# Patient Record
Sex: Female | Born: 1945
Health system: Southern US, Community
[De-identification: ages and names within clinical notes are randomized; demographics above are authoritative.]

---

## 1999-03-15 ENCOUNTER — Other Ambulatory Visit: Admission: RE | Admit: 1999-03-15 | Discharge: 1999-03-15 | Payer: Self-pay | Admitting: Internal Medicine

## 2000-11-11 ENCOUNTER — Encounter: Payer: Self-pay | Admitting: Internal Medicine

## 2000-11-11 ENCOUNTER — Encounter: Admission: RE | Admit: 2000-11-11 | Discharge: 2000-11-11 | Payer: Self-pay | Admitting: Internal Medicine

## 2001-01-29 ENCOUNTER — Encounter: Admission: RE | Admit: 2001-01-29 | Discharge: 2001-01-29 | Payer: Self-pay | Admitting: Urology

## 2001-01-29 ENCOUNTER — Encounter: Payer: Self-pay | Admitting: Urology

## 2001-11-04 ENCOUNTER — Other Ambulatory Visit: Admission: RE | Admit: 2001-11-04 | Discharge: 2001-11-04 | Payer: Self-pay | Admitting: Internal Medicine

## 2001-11-24 ENCOUNTER — Ambulatory Visit (HOSPITAL_COMMUNITY): Admission: RE | Admit: 2001-11-24 | Discharge: 2001-11-24 | Payer: Self-pay | Admitting: Internal Medicine

## 2003-11-22 ENCOUNTER — Ambulatory Visit (HOSPITAL_COMMUNITY): Admission: RE | Admit: 2003-11-22 | Discharge: 2003-11-22 | Payer: Self-pay | Admitting: Gastroenterology

## 2004-03-08 ENCOUNTER — Other Ambulatory Visit: Admission: RE | Admit: 2004-03-08 | Discharge: 2004-03-08 | Payer: Self-pay | Admitting: Internal Medicine

## 2004-03-09 ENCOUNTER — Encounter: Admission: RE | Admit: 2004-03-09 | Discharge: 2004-03-09 | Payer: Self-pay | Admitting: Internal Medicine

## 2004-03-22 ENCOUNTER — Ambulatory Visit (HOSPITAL_COMMUNITY): Admission: RE | Admit: 2004-03-22 | Discharge: 2004-03-22 | Payer: Self-pay | Admitting: Internal Medicine

## 2004-12-26 ENCOUNTER — Encounter: Admission: RE | Admit: 2004-12-26 | Discharge: 2004-12-26 | Payer: Self-pay | Admitting: Internal Medicine

## 2005-03-11 ENCOUNTER — Other Ambulatory Visit: Admission: RE | Admit: 2005-03-11 | Discharge: 2005-03-11 | Payer: Self-pay | Admitting: Internal Medicine

## 2005-03-14 ENCOUNTER — Encounter: Admission: RE | Admit: 2005-03-14 | Discharge: 2005-03-14 | Payer: Self-pay | Admitting: Surgery

## 2006-03-12 ENCOUNTER — Other Ambulatory Visit: Admission: RE | Admit: 2006-03-12 | Discharge: 2006-03-12 | Payer: Self-pay | Admitting: Internal Medicine

## 2014-07-06 ENCOUNTER — Other Ambulatory Visit (HOSPITAL_COMMUNITY): Payer: Self-pay | Admitting: Family Medicine

## 2014-07-06 ENCOUNTER — Ambulatory Visit (HOSPITAL_COMMUNITY)
Admission: RE | Admit: 2014-07-06 | Discharge: 2014-07-06 | Disposition: A | Discharge status: Home / Self Care | Payer: Medicare HMO | Source: Ambulatory Visit | Attending: Family Medicine | Admitting: Family Medicine

## 2014-07-06 DIAGNOSIS — R609 Edema, unspecified: Secondary | ICD-10-CM

## 2014-07-06 DIAGNOSIS — M7989 Other specified soft tissue disorders: Secondary | ICD-10-CM | POA: Diagnosis not present

## 2014-07-06 DIAGNOSIS — M79609 Pain in unspecified limb: Secondary | ICD-10-CM | POA: Insufficient documentation

## 2014-07-06 DIAGNOSIS — M79605 Pain in left leg: Secondary | ICD-10-CM

## 2014-07-06 NOTE — Progress Notes (Signed)
VASCULAR LAB PRELIMINARY  PRELIMINARY  PRELIMINARY  PRELIMINARY  Left lower extremity venous duplex completed.    Preliminary report:  Left:  No evidence of DVT, superficial thrombosis, or Baker's cyst.  Kobee Medlen, RVS 07/06/2014, 5:34 PM

## 2014-09-28 ENCOUNTER — Ambulatory Visit
Admission: RE | Admit: 2014-09-28 | Discharge: 2014-09-28 | Disposition: A | Discharge status: Home / Self Care | Payer: Medicare HMO | Source: Ambulatory Visit | Attending: Family Medicine | Admitting: Family Medicine

## 2014-09-28 ENCOUNTER — Other Ambulatory Visit: Payer: Self-pay | Admitting: Family Medicine

## 2014-09-28 DIAGNOSIS — M549 Dorsalgia, unspecified: Secondary | ICD-10-CM

## 2014-09-28 DIAGNOSIS — R319 Hematuria, unspecified: Secondary | ICD-10-CM

## 2015-10-11 DIAGNOSIS — E119 Type 2 diabetes mellitus without complications: Secondary | ICD-10-CM | POA: Diagnosis not present

## 2015-10-11 DIAGNOSIS — I10 Essential (primary) hypertension: Secondary | ICD-10-CM | POA: Diagnosis not present

## 2015-10-11 DIAGNOSIS — Z5181 Encounter for therapeutic drug level monitoring: Secondary | ICD-10-CM | POA: Diagnosis not present

## 2015-10-11 DIAGNOSIS — Z7984 Long term (current) use of oral hypoglycemic drugs: Secondary | ICD-10-CM | POA: Diagnosis not present

## 2015-10-11 DIAGNOSIS — E039 Hypothyroidism, unspecified: Secondary | ICD-10-CM | POA: Diagnosis not present

## 2015-11-21 DIAGNOSIS — M533 Sacrococcygeal disorders, not elsewhere classified: Secondary | ICD-10-CM | POA: Diagnosis not present

## 2015-11-21 DIAGNOSIS — M5430 Sciatica, unspecified side: Secondary | ICD-10-CM | POA: Diagnosis not present

## 2015-11-21 DIAGNOSIS — K047 Periapical abscess without sinus: Secondary | ICD-10-CM | POA: Diagnosis not present

## 2015-11-21 DIAGNOSIS — J3489 Other specified disorders of nose and nasal sinuses: Secondary | ICD-10-CM | POA: Diagnosis not present

## 2015-12-06 ENCOUNTER — Other Ambulatory Visit: Payer: Self-pay | Admitting: Family Medicine

## 2015-12-06 DIAGNOSIS — M5432 Sciatica, left side: Secondary | ICD-10-CM

## 2015-12-11 ENCOUNTER — Ambulatory Visit
Admission: RE | Admit: 2015-12-11 | Discharge: 2015-12-11 | Disposition: A | Discharge status: Home / Self Care | Payer: PPO | Source: Ambulatory Visit | Attending: Family Medicine | Admitting: Family Medicine

## 2015-12-11 DIAGNOSIS — M5432 Sciatica, left side: Secondary | ICD-10-CM

## 2015-12-11 DIAGNOSIS — M4806 Spinal stenosis, lumbar region: Secondary | ICD-10-CM | POA: Diagnosis not present

## 2015-12-15 DIAGNOSIS — M1990 Unspecified osteoarthritis, unspecified site: Secondary | ICD-10-CM | POA: Diagnosis not present

## 2015-12-15 DIAGNOSIS — M5430 Sciatica, unspecified side: Secondary | ICD-10-CM | POA: Diagnosis not present

## 2016-01-01 DIAGNOSIS — M5430 Sciatica, unspecified side: Secondary | ICD-10-CM | POA: Diagnosis not present

## 2016-01-01 DIAGNOSIS — R21 Rash and other nonspecific skin eruption: Secondary | ICD-10-CM | POA: Diagnosis not present

## 2016-01-01 DIAGNOSIS — M1990 Unspecified osteoarthritis, unspecified site: Secondary | ICD-10-CM | POA: Diagnosis not present

## 2016-01-08 DIAGNOSIS — M5430 Sciatica, unspecified side: Secondary | ICD-10-CM | POA: Diagnosis not present

## 2016-01-08 DIAGNOSIS — M1990 Unspecified osteoarthritis, unspecified site: Secondary | ICD-10-CM | POA: Diagnosis not present

## 2016-01-15 DIAGNOSIS — M1990 Unspecified osteoarthritis, unspecified site: Secondary | ICD-10-CM | POA: Diagnosis not present

## 2016-01-15 DIAGNOSIS — M5430 Sciatica, unspecified side: Secondary | ICD-10-CM | POA: Diagnosis not present

## 2016-02-14 DIAGNOSIS — Z85820 Personal history of malignant melanoma of skin: Secondary | ICD-10-CM | POA: Diagnosis not present

## 2016-02-14 DIAGNOSIS — D225 Melanocytic nevi of trunk: Secondary | ICD-10-CM | POA: Diagnosis not present

## 2016-02-14 DIAGNOSIS — L821 Other seborrheic keratosis: Secondary | ICD-10-CM | POA: Diagnosis not present

## 2016-02-14 DIAGNOSIS — L304 Erythema intertrigo: Secondary | ICD-10-CM | POA: Diagnosis not present

## 2016-02-14 DIAGNOSIS — L853 Xerosis cutis: Secondary | ICD-10-CM | POA: Diagnosis not present

## 2016-02-14 DIAGNOSIS — D1801 Hemangioma of skin and subcutaneous tissue: Secondary | ICD-10-CM | POA: Diagnosis not present

## 2016-02-14 DIAGNOSIS — L281 Prurigo nodularis: Secondary | ICD-10-CM | POA: Diagnosis not present

## 2016-02-29 DIAGNOSIS — M8589 Other specified disorders of bone density and structure, multiple sites: Secondary | ICD-10-CM | POA: Diagnosis not present

## 2016-04-12 DIAGNOSIS — Z1231 Encounter for screening mammogram for malignant neoplasm of breast: Secondary | ICD-10-CM | POA: Diagnosis not present

## 2016-04-12 DIAGNOSIS — Z803 Family history of malignant neoplasm of breast: Secondary | ICD-10-CM | POA: Diagnosis not present

## 2016-04-16 DIAGNOSIS — R928 Other abnormal and inconclusive findings on diagnostic imaging of breast: Secondary | ICD-10-CM | POA: Diagnosis not present

## 2016-04-16 DIAGNOSIS — R922 Inconclusive mammogram: Secondary | ICD-10-CM | POA: Diagnosis not present

## 2016-04-19 DIAGNOSIS — G8929 Other chronic pain: Secondary | ICD-10-CM | POA: Diagnosis not present

## 2016-04-19 DIAGNOSIS — M5440 Lumbago with sciatica, unspecified side: Secondary | ICD-10-CM | POA: Diagnosis not present

## 2016-05-09 DIAGNOSIS — H25041 Posterior subcapsular polar age-related cataract, right eye: Secondary | ICD-10-CM | POA: Diagnosis not present

## 2016-05-09 DIAGNOSIS — H353130 Nonexudative age-related macular degeneration, bilateral, stage unspecified: Secondary | ICD-10-CM | POA: Diagnosis not present

## 2016-05-09 DIAGNOSIS — H2513 Age-related nuclear cataract, bilateral: Secondary | ICD-10-CM | POA: Diagnosis not present

## 2016-05-09 DIAGNOSIS — H40013 Open angle with borderline findings, low risk, bilateral: Secondary | ICD-10-CM | POA: Diagnosis not present

## 2016-05-09 DIAGNOSIS — E119 Type 2 diabetes mellitus without complications: Secondary | ICD-10-CM | POA: Diagnosis not present

## 2016-05-15 DIAGNOSIS — J0100 Acute maxillary sinusitis, unspecified: Secondary | ICD-10-CM | POA: Diagnosis not present

## 2016-06-11 DIAGNOSIS — E782 Mixed hyperlipidemia: Secondary | ICD-10-CM | POA: Diagnosis not present

## 2016-06-11 DIAGNOSIS — M542 Cervicalgia: Secondary | ICD-10-CM | POA: Diagnosis not present

## 2016-06-11 DIAGNOSIS — M150 Primary generalized (osteo)arthritis: Secondary | ICD-10-CM | POA: Diagnosis not present

## 2016-06-11 DIAGNOSIS — E119 Type 2 diabetes mellitus without complications: Secondary | ICD-10-CM | POA: Diagnosis not present

## 2016-06-11 DIAGNOSIS — Z79899 Other long term (current) drug therapy: Secondary | ICD-10-CM | POA: Diagnosis not present

## 2016-06-11 DIAGNOSIS — M2550 Pain in unspecified joint: Secondary | ICD-10-CM | POA: Diagnosis not present

## 2016-06-11 DIAGNOSIS — M545 Low back pain: Secondary | ICD-10-CM | POA: Diagnosis not present

## 2016-06-11 DIAGNOSIS — H209 Unspecified iridocyclitis: Secondary | ICD-10-CM | POA: Diagnosis not present

## 2016-06-11 DIAGNOSIS — M0230 Reiter's disease, unspecified site: Secondary | ICD-10-CM | POA: Diagnosis not present

## 2016-06-11 DIAGNOSIS — Z7984 Long term (current) use of oral hypoglycemic drugs: Secondary | ICD-10-CM | POA: Diagnosis not present

## 2016-06-11 DIAGNOSIS — E039 Hypothyroidism, unspecified: Secondary | ICD-10-CM | POA: Diagnosis not present

## 2016-06-11 DIAGNOSIS — Z5181 Encounter for therapeutic drug level monitoring: Secondary | ICD-10-CM | POA: Diagnosis not present

## 2016-06-13 DIAGNOSIS — E119 Type 2 diabetes mellitus without complications: Secondary | ICD-10-CM | POA: Diagnosis not present

## 2016-06-13 DIAGNOSIS — I10 Essential (primary) hypertension: Secondary | ICD-10-CM | POA: Diagnosis not present

## 2016-06-13 DIAGNOSIS — Z7984 Long term (current) use of oral hypoglycemic drugs: Secondary | ICD-10-CM | POA: Diagnosis not present

## 2016-06-13 DIAGNOSIS — Z6841 Body Mass Index (BMI) 40.0 and over, adult: Secondary | ICD-10-CM | POA: Diagnosis not present

## 2016-06-13 DIAGNOSIS — Z5181 Encounter for therapeutic drug level monitoring: Secondary | ICD-10-CM | POA: Diagnosis not present

## 2016-06-13 DIAGNOSIS — E039 Hypothyroidism, unspecified: Secondary | ICD-10-CM | POA: Diagnosis not present

## 2016-07-03 DIAGNOSIS — F330 Major depressive disorder, recurrent, mild: Secondary | ICD-10-CM | POA: Diagnosis not present

## 2016-07-03 DIAGNOSIS — F419 Anxiety disorder, unspecified: Secondary | ICD-10-CM | POA: Diagnosis not present

## 2016-07-03 DIAGNOSIS — E785 Hyperlipidemia, unspecified: Secondary | ICD-10-CM | POA: Diagnosis not present

## 2016-07-03 DIAGNOSIS — H3530 Unspecified macular degeneration: Secondary | ICD-10-CM | POA: Diagnosis not present

## 2016-07-03 DIAGNOSIS — I10 Essential (primary) hypertension: Secondary | ICD-10-CM | POA: Diagnosis not present

## 2016-07-03 DIAGNOSIS — Z0000 Encounter for general adult medical examination without abnormal findings: Secondary | ICD-10-CM | POA: Diagnosis not present

## 2016-07-11 DIAGNOSIS — H209 Unspecified iridocyclitis: Secondary | ICD-10-CM | POA: Diagnosis not present

## 2016-07-11 DIAGNOSIS — M0230 Reiter's disease, unspecified site: Secondary | ICD-10-CM | POA: Diagnosis not present

## 2016-07-11 DIAGNOSIS — M545 Low back pain: Secondary | ICD-10-CM | POA: Diagnosis not present

## 2016-07-11 DIAGNOSIS — M150 Primary generalized (osteo)arthritis: Secondary | ICD-10-CM | POA: Diagnosis not present

## 2016-07-11 DIAGNOSIS — M5030 Other cervical disc degeneration, unspecified cervical region: Secondary | ICD-10-CM | POA: Diagnosis not present

## 2016-07-11 DIAGNOSIS — M2550 Pain in unspecified joint: Secondary | ICD-10-CM | POA: Diagnosis not present

## 2016-07-15 DIAGNOSIS — R6884 Jaw pain: Secondary | ICD-10-CM | POA: Diagnosis not present

## 2016-07-15 DIAGNOSIS — J3489 Other specified disorders of nose and nasal sinuses: Secondary | ICD-10-CM | POA: Diagnosis not present

## 2016-07-15 DIAGNOSIS — T819XXA Unspecified complication of procedure, initial encounter: Secondary | ICD-10-CM | POA: Diagnosis not present

## 2016-07-19 DIAGNOSIS — R6884 Jaw pain: Secondary | ICD-10-CM | POA: Diagnosis not present

## 2016-07-19 DIAGNOSIS — J3489 Other specified disorders of nose and nasal sinuses: Secondary | ICD-10-CM | POA: Diagnosis not present

## 2016-07-19 DIAGNOSIS — T819XXA Unspecified complication of procedure, initial encounter: Secondary | ICD-10-CM | POA: Diagnosis not present

## 2016-07-22 DIAGNOSIS — Z1211 Encounter for screening for malignant neoplasm of colon: Secondary | ICD-10-CM | POA: Diagnosis not present

## 2016-07-22 DIAGNOSIS — R1310 Dysphagia, unspecified: Secondary | ICD-10-CM | POA: Diagnosis not present

## 2016-07-22 DIAGNOSIS — K219 Gastro-esophageal reflux disease without esophagitis: Secondary | ICD-10-CM | POA: Diagnosis not present

## 2016-08-28 DIAGNOSIS — K640 First degree hemorrhoids: Secondary | ICD-10-CM | POA: Diagnosis not present

## 2016-08-28 DIAGNOSIS — K209 Esophagitis, unspecified: Secondary | ICD-10-CM | POA: Diagnosis not present

## 2016-08-28 DIAGNOSIS — R1310 Dysphagia, unspecified: Secondary | ICD-10-CM | POA: Diagnosis not present

## 2016-08-28 DIAGNOSIS — K2900 Acute gastritis without bleeding: Secondary | ICD-10-CM | POA: Diagnosis not present

## 2016-08-28 DIAGNOSIS — Z1211 Encounter for screening for malignant neoplasm of colon: Secondary | ICD-10-CM | POA: Diagnosis not present

## 2016-08-28 DIAGNOSIS — K219 Gastro-esophageal reflux disease without esophagitis: Secondary | ICD-10-CM | POA: Diagnosis not present

## 2016-09-03 DIAGNOSIS — Z1211 Encounter for screening for malignant neoplasm of colon: Secondary | ICD-10-CM | POA: Diagnosis not present

## 2016-09-03 DIAGNOSIS — K219 Gastro-esophageal reflux disease without esophagitis: Secondary | ICD-10-CM | POA: Diagnosis not present

## 2016-09-20 DIAGNOSIS — I10 Essential (primary) hypertension: Secondary | ICD-10-CM | POA: Diagnosis not present

## 2016-09-20 DIAGNOSIS — E119 Type 2 diabetes mellitus without complications: Secondary | ICD-10-CM | POA: Diagnosis not present

## 2016-09-20 DIAGNOSIS — Z5181 Encounter for therapeutic drug level monitoring: Secondary | ICD-10-CM | POA: Diagnosis not present

## 2016-09-20 DIAGNOSIS — E039 Hypothyroidism, unspecified: Secondary | ICD-10-CM | POA: Diagnosis not present

## 2016-09-24 DIAGNOSIS — F330 Major depressive disorder, recurrent, mild: Secondary | ICD-10-CM | POA: Diagnosis not present

## 2016-10-25 DIAGNOSIS — F330 Major depressive disorder, recurrent, mild: Secondary | ICD-10-CM | POA: Diagnosis not present

## 2016-11-21 DIAGNOSIS — H353132 Nonexudative age-related macular degeneration, bilateral, intermediate dry stage: Secondary | ICD-10-CM | POA: Diagnosis not present

## 2016-11-21 DIAGNOSIS — H5319 Other subjective visual disturbances: Secondary | ICD-10-CM | POA: Diagnosis not present

## 2016-11-21 DIAGNOSIS — H40013 Open angle with borderline findings, low risk, bilateral: Secondary | ICD-10-CM | POA: Diagnosis not present

## 2016-11-21 DIAGNOSIS — E119 Type 2 diabetes mellitus without complications: Secondary | ICD-10-CM | POA: Diagnosis not present

## 2017-01-09 DIAGNOSIS — M0230 Reiter's disease, unspecified site: Secondary | ICD-10-CM | POA: Diagnosis not present

## 2017-01-09 DIAGNOSIS — M5136 Other intervertebral disc degeneration, lumbar region: Secondary | ICD-10-CM | POA: Diagnosis not present

## 2017-01-09 DIAGNOSIS — H209 Unspecified iridocyclitis: Secondary | ICD-10-CM | POA: Diagnosis not present

## 2017-01-09 DIAGNOSIS — Z6841 Body Mass Index (BMI) 40.0 and over, adult: Secondary | ICD-10-CM | POA: Diagnosis not present

## 2017-01-09 DIAGNOSIS — M5030 Other cervical disc degeneration, unspecified cervical region: Secondary | ICD-10-CM | POA: Diagnosis not present

## 2017-01-09 DIAGNOSIS — M2550 Pain in unspecified joint: Secondary | ICD-10-CM | POA: Diagnosis not present

## 2017-01-09 DIAGNOSIS — M150 Primary generalized (osteo)arthritis: Secondary | ICD-10-CM | POA: Diagnosis not present

## 2017-01-09 DIAGNOSIS — M545 Low back pain: Secondary | ICD-10-CM | POA: Diagnosis not present

## 2017-02-20 DIAGNOSIS — Z85828 Personal history of other malignant neoplasm of skin: Secondary | ICD-10-CM | POA: Diagnosis not present

## 2017-02-20 DIAGNOSIS — D1801 Hemangioma of skin and subcutaneous tissue: Secondary | ICD-10-CM | POA: Diagnosis not present

## 2017-02-20 DIAGNOSIS — L821 Other seborrheic keratosis: Secondary | ICD-10-CM | POA: Diagnosis not present

## 2017-02-20 DIAGNOSIS — L814 Other melanin hyperpigmentation: Secondary | ICD-10-CM | POA: Diagnosis not present

## 2017-02-20 DIAGNOSIS — Z85820 Personal history of malignant melanoma of skin: Secondary | ICD-10-CM | POA: Diagnosis not present

## 2017-03-03 DIAGNOSIS — I10 Essential (primary) hypertension: Secondary | ICD-10-CM | POA: Diagnosis not present

## 2017-03-03 DIAGNOSIS — Z5181 Encounter for therapeutic drug level monitoring: Secondary | ICD-10-CM | POA: Diagnosis not present

## 2017-03-03 DIAGNOSIS — E039 Hypothyroidism, unspecified: Secondary | ICD-10-CM | POA: Diagnosis not present

## 2017-03-03 DIAGNOSIS — E119 Type 2 diabetes mellitus without complications: Secondary | ICD-10-CM | POA: Diagnosis not present

## 2017-03-03 DIAGNOSIS — Z6841 Body Mass Index (BMI) 40.0 and over, adult: Secondary | ICD-10-CM | POA: Diagnosis not present

## 2017-04-14 DIAGNOSIS — Z803 Family history of malignant neoplasm of breast: Secondary | ICD-10-CM | POA: Diagnosis not present

## 2017-04-14 DIAGNOSIS — Z1231 Encounter for screening mammogram for malignant neoplasm of breast: Secondary | ICD-10-CM | POA: Diagnosis not present

## 2017-05-21 DIAGNOSIS — E119 Type 2 diabetes mellitus without complications: Secondary | ICD-10-CM | POA: Diagnosis not present

## 2017-05-21 DIAGNOSIS — I708 Atherosclerosis of other arteries: Secondary | ICD-10-CM | POA: Diagnosis not present

## 2017-05-21 DIAGNOSIS — H40013 Open angle with borderline findings, low risk, bilateral: Secondary | ICD-10-CM | POA: Diagnosis not present

## 2017-05-21 DIAGNOSIS — H353132 Nonexudative age-related macular degeneration, bilateral, intermediate dry stage: Secondary | ICD-10-CM | POA: Diagnosis not present

## 2017-05-21 DIAGNOSIS — H2513 Age-related nuclear cataract, bilateral: Secondary | ICD-10-CM | POA: Diagnosis not present

## 2017-06-11 DIAGNOSIS — R0982 Postnasal drip: Secondary | ICD-10-CM | POA: Diagnosis not present

## 2017-06-11 DIAGNOSIS — I10 Essential (primary) hypertension: Secondary | ICD-10-CM | POA: Diagnosis not present

## 2017-06-30 DIAGNOSIS — I10 Essential (primary) hypertension: Secondary | ICD-10-CM | POA: Diagnosis not present

## 2017-06-30 DIAGNOSIS — E039 Hypothyroidism, unspecified: Secondary | ICD-10-CM | POA: Diagnosis not present

## 2017-06-30 DIAGNOSIS — E119 Type 2 diabetes mellitus without complications: Secondary | ICD-10-CM | POA: Diagnosis not present

## 2017-06-30 DIAGNOSIS — E559 Vitamin D deficiency, unspecified: Secondary | ICD-10-CM | POA: Diagnosis not present

## 2017-06-30 DIAGNOSIS — E785 Hyperlipidemia, unspecified: Secondary | ICD-10-CM | POA: Diagnosis not present

## 2017-07-18 DIAGNOSIS — F330 Major depressive disorder, recurrent, mild: Secondary | ICD-10-CM | POA: Diagnosis not present

## 2017-07-18 DIAGNOSIS — E785 Hyperlipidemia, unspecified: Secondary | ICD-10-CM | POA: Diagnosis not present

## 2017-07-18 DIAGNOSIS — L290 Pruritus ani: Secondary | ICD-10-CM | POA: Diagnosis not present

## 2017-07-18 DIAGNOSIS — I10 Essential (primary) hypertension: Secondary | ICD-10-CM | POA: Diagnosis not present

## 2017-07-18 DIAGNOSIS — G4730 Sleep apnea, unspecified: Secondary | ICD-10-CM | POA: Diagnosis not present

## 2017-07-18 DIAGNOSIS — E119 Type 2 diabetes mellitus without complications: Secondary | ICD-10-CM | POA: Diagnosis not present

## 2017-07-18 DIAGNOSIS — E039 Hypothyroidism, unspecified: Secondary | ICD-10-CM | POA: Diagnosis not present

## 2017-08-13 DIAGNOSIS — E039 Hypothyroidism, unspecified: Secondary | ICD-10-CM | POA: Diagnosis not present

## 2017-08-13 DIAGNOSIS — E559 Vitamin D deficiency, unspecified: Secondary | ICD-10-CM | POA: Diagnosis not present

## 2017-08-13 DIAGNOSIS — Z0000 Encounter for general adult medical examination without abnormal findings: Secondary | ICD-10-CM | POA: Diagnosis not present

## 2017-08-13 DIAGNOSIS — I10 Essential (primary) hypertension: Secondary | ICD-10-CM | POA: Diagnosis not present

## 2017-08-13 DIAGNOSIS — E785 Hyperlipidemia, unspecified: Secondary | ICD-10-CM | POA: Diagnosis not present

## 2017-08-13 DIAGNOSIS — E119 Type 2 diabetes mellitus without complications: Secondary | ICD-10-CM | POA: Diagnosis not present

## 2017-12-17 DIAGNOSIS — Z8669 Personal history of other diseases of the nervous system and sense organs: Secondary | ICD-10-CM | POA: Diagnosis not present

## 2017-12-17 DIAGNOSIS — M25562 Pain in left knee: Secondary | ICD-10-CM | POA: Diagnosis not present

## 2017-12-17 DIAGNOSIS — M25561 Pain in right knee: Secondary | ICD-10-CM | POA: Diagnosis not present

## 2017-12-30 ENCOUNTER — Encounter: Payer: Self-pay | Admitting: Podiatry

## 2017-12-30 ENCOUNTER — Ambulatory Visit: Payer: PPO | Admitting: Podiatry

## 2017-12-30 VITALS — BP 158/78 | HR 96 | Resp 16

## 2017-12-30 DIAGNOSIS — M216X2 Other acquired deformities of left foot: Secondary | ICD-10-CM | POA: Diagnosis not present

## 2017-12-30 DIAGNOSIS — M216X1 Other acquired deformities of right foot: Secondary | ICD-10-CM | POA: Diagnosis not present

## 2017-12-30 NOTE — Progress Notes (Signed)
  Subjective:  Patient ID: Bonnie Snyder, female    DOB: [birth year],  MRN: [id number] HPI Chief Complaint  Patient presents with  . Knee Pain    Patient states she has intermittent pain in both her knees and concerned lower limb structure is the cause, would like any recommendations that may help    72 y.o. female presents with the above complaint.   ROS: Denies fever chills nausea vomiting muscle aches pains calf pain shortness of breath chest pain headache.  No past medical history on file.   Current Outpatient Medications:  .  BIOTIN PO, Take by mouth., Disp: , Rfl:  .  Cholecalciferol (VITAMIN D-3 PO), Take by mouth., Disp: , Rfl:  .  Multiple Vitamins-Minerals (PRESERVISION/LUTEIN PO), Take by mouth., Disp: , Rfl:  .  omeprazole (PRILOSEC) 20 MG capsule, Take 20 mg by mouth daily., Disp: , Rfl:  .  hydrochlorothiazide (HYDRODIURIL) 25 MG tablet, , Disp: , Rfl:  .  levothyroxine (SYNTHROID, LEVOTHROID) 125 MCG tablet, , Disp: , Rfl:   Allergies  Allergen Reactions  . Atorvastatin     Other reaction(s): Myalgias (intolerance)  . Atropine     Other reaction(s): Other (See Comments) Eye dialation  . Clindamycin Diarrhea  . Penicillin G     Other reaction(s): Other (See Comments) Does not remember  . Rosuvastatin     Other reaction(s): Confusion (intolerance)  . Sulfamethoxazole     Other reaction(s): Fever (intolerance)  . Latex Rash   Review of Systems Objective:   Vitals:   12/30/17 1111  BP: (!) 158/78  Pulse: 96  Resp: 16    General: Well developed, nourished, in no acute distress, alert and oriented x3   Dermatological: Skin is warm, dry and supple bilateral. Nails x 10 are well maintained; remaining integument appears unremarkable at this time. There are no open sores, no preulcerative lesions, no rash or signs of infection present.  Vascular: Dorsalis Pedis artery and Posterior Tibial artery pedal pulses are 2/4 bilateral with immedate capillary fill  time. Pedal hair growth present. No varicosities and no lower extremity edema present bilateral.   Neruologic: Grossly intact via light touch bilateral. Vibratory intact via tuning fork bilateral. Protective threshold with Semmes Wienstein monofilament intact to all pedal sites bilateral. Patellar and Achilles deep tendon reflexes 2+ bilateral. No Babinski or clonus noted bilateral.   Musculoskeletal: No gross boney pedal deformities bilateral. No pain, crepitus, or limitation noted with foot and ankle range of motion bilateral. Muscular strength 5/5 in all groups tested bilateral.  Mild pronation with ambulation walks with an antalgic gait to right knee pain.  Otherwise all joints have good full range of motion.  Gait: Unassisted, Nonantalgic.    Radiographs:  No acute findings rectus foot type mild pronation  Assessment & Plan:   Assessment: Pronation bilateral foot knee pain right.  Plan: Discussed etiology pathology conservative versus surgical therapies I think it is necessary that this patient see [facility] Channel for set of orthotics.     Max T. Lacey, [place]

## 2018-01-29 ENCOUNTER — Ambulatory Visit: Payer: PPO | Admitting: Orthotics

## 2018-01-29 DIAGNOSIS — M216X1 Other acquired deformities of right foot: Secondary | ICD-10-CM

## 2018-01-29 DIAGNOSIS — M216X2 Other acquired deformities of left foot: Principal | ICD-10-CM

## 2018-01-29 NOTE — Progress Notes (Signed)
Patient came in today to pick up custom made foot orthotics.  The goals were accomplished and the patient reported no dissatisfaction with said orthotics.  Patient was advised of breakin period and how to report any issues. 

## 2018-02-11 ENCOUNTER — Telehealth: Payer: Self-pay | Admitting: Podiatry

## 2018-02-11 NOTE — Telephone Encounter (Signed)
Pt left voicemail on call center line about a billing issue.  I have called pt and left a voicemail for her to call me back.

## 2018-02-12 DIAGNOSIS — M25561 Pain in right knee: Secondary | ICD-10-CM | POA: Diagnosis not present

## 2018-02-12 NOTE — Telephone Encounter (Signed)
Pt returned my call at 524pm on 5.15.19 and had to leave a message.  I returned call and pt stated that when she picked up the orthotics on 5.2.19 that the person at the check out said she did not see where pt had paid anything. But pt states she paid 100.00.  I looked into chart and there is a 100.00 undistributed for the orthotics but it has not been attached to the orthotic claim because it shows as pending with the insurance.As soon as the insurance company denies the claim we are able to attach it and pt will be sent a bill for the balance.

## 2018-03-18 DIAGNOSIS — M25561 Pain in right knee: Secondary | ICD-10-CM | POA: Diagnosis not present

## 2018-03-18 DIAGNOSIS — M1711 Unilateral primary osteoarthritis, right knee: Secondary | ICD-10-CM | POA: Diagnosis not present

## 2018-04-20 DIAGNOSIS — M8589 Other specified disorders of bone density and structure, multiple sites: Secondary | ICD-10-CM | POA: Diagnosis not present

## 2018-04-20 DIAGNOSIS — Z1231 Encounter for screening mammogram for malignant neoplasm of breast: Secondary | ICD-10-CM | POA: Diagnosis not present

## 2018-05-27 DIAGNOSIS — H353132 Nonexudative age-related macular degeneration, bilateral, intermediate dry stage: Secondary | ICD-10-CM | POA: Diagnosis not present

## 2018-05-27 DIAGNOSIS — H40013 Open angle with borderline findings, low risk, bilateral: Secondary | ICD-10-CM | POA: Diagnosis not present

## 2018-05-27 DIAGNOSIS — E119 Type 2 diabetes mellitus without complications: Secondary | ICD-10-CM | POA: Diagnosis not present

## 2018-05-27 DIAGNOSIS — H2513 Age-related nuclear cataract, bilateral: Secondary | ICD-10-CM | POA: Diagnosis not present

## 2018-06-02 DIAGNOSIS — N63 Unspecified lump in unspecified breast: Secondary | ICD-10-CM | POA: Diagnosis not present

## 2018-06-02 DIAGNOSIS — L304 Erythema intertrigo: Secondary | ICD-10-CM | POA: Diagnosis not present

## 2018-06-11 DIAGNOSIS — R928 Other abnormal and inconclusive findings on diagnostic imaging of breast: Secondary | ICD-10-CM | POA: Diagnosis not present

## 2018-06-11 DIAGNOSIS — N6324 Unspecified lump in the left breast, lower inner quadrant: Secondary | ICD-10-CM | POA: Diagnosis not present

## 2018-08-18 DIAGNOSIS — D225 Melanocytic nevi of trunk: Secondary | ICD-10-CM | POA: Diagnosis not present

## 2018-08-18 DIAGNOSIS — L821 Other seborrheic keratosis: Secondary | ICD-10-CM | POA: Diagnosis not present

## 2018-08-18 DIAGNOSIS — L820 Inflamed seborrheic keratosis: Secondary | ICD-10-CM | POA: Diagnosis not present

## 2018-08-18 DIAGNOSIS — D1801 Hemangioma of skin and subcutaneous tissue: Secondary | ICD-10-CM | POA: Diagnosis not present

## 2018-08-18 DIAGNOSIS — L814 Other melanin hyperpigmentation: Secondary | ICD-10-CM | POA: Diagnosis not present

## 2018-09-09 DIAGNOSIS — I10 Essential (primary) hypertension: Secondary | ICD-10-CM | POA: Diagnosis not present

## 2018-09-09 DIAGNOSIS — E559 Vitamin D deficiency, unspecified: Secondary | ICD-10-CM | POA: Diagnosis not present

## 2018-09-09 DIAGNOSIS — E039 Hypothyroidism, unspecified: Secondary | ICD-10-CM | POA: Diagnosis not present

## 2018-09-09 DIAGNOSIS — E119 Type 2 diabetes mellitus without complications: Secondary | ICD-10-CM | POA: Diagnosis not present

## 2018-09-09 DIAGNOSIS — E785 Hyperlipidemia, unspecified: Secondary | ICD-10-CM | POA: Diagnosis not present

## 2018-09-14 DIAGNOSIS — R1011 Right upper quadrant pain: Secondary | ICD-10-CM | POA: Diagnosis not present

## 2018-09-14 DIAGNOSIS — E119 Type 2 diabetes mellitus without complications: Secondary | ICD-10-CM | POA: Diagnosis not present

## 2018-09-14 DIAGNOSIS — G4730 Sleep apnea, unspecified: Secondary | ICD-10-CM | POA: Diagnosis not present

## 2018-09-14 DIAGNOSIS — Z6841 Body Mass Index (BMI) 40.0 and over, adult: Secondary | ICD-10-CM | POA: Diagnosis not present

## 2018-09-14 DIAGNOSIS — Z0000 Encounter for general adult medical examination without abnormal findings: Secondary | ICD-10-CM | POA: Diagnosis not present

## 2018-09-14 DIAGNOSIS — Z23 Encounter for immunization: Secondary | ICD-10-CM | POA: Diagnosis not present

## 2018-09-14 DIAGNOSIS — H3530 Unspecified macular degeneration: Secondary | ICD-10-CM | POA: Diagnosis not present

## 2018-09-14 DIAGNOSIS — E785 Hyperlipidemia, unspecified: Secondary | ICD-10-CM | POA: Diagnosis not present

## 2018-09-14 DIAGNOSIS — F330 Major depressive disorder, recurrent, mild: Secondary | ICD-10-CM | POA: Diagnosis not present

## 2018-09-14 DIAGNOSIS — I10 Essential (primary) hypertension: Secondary | ICD-10-CM | POA: Diagnosis not present

## 2018-09-17 ENCOUNTER — Other Ambulatory Visit: Payer: Self-pay | Admitting: Family Medicine

## 2018-09-17 DIAGNOSIS — R1011 Right upper quadrant pain: Secondary | ICD-10-CM

## 2018-09-17 DIAGNOSIS — K8020 Calculus of gallbladder without cholecystitis without obstruction: Secondary | ICD-10-CM

## 2018-09-25 ENCOUNTER — Ambulatory Visit
Admission: RE | Admit: 2018-09-25 | Discharge: 2018-09-25 | Disposition: A | Discharge status: Home / Self Care | Payer: PPO | Source: Ambulatory Visit | Attending: Family Medicine | Admitting: Family Medicine

## 2018-09-25 DIAGNOSIS — R1011 Right upper quadrant pain: Secondary | ICD-10-CM

## 2018-09-25 DIAGNOSIS — K8020 Calculus of gallbladder without cholecystitis without obstruction: Secondary | ICD-10-CM

## 2018-09-25 DIAGNOSIS — K7689 Other specified diseases of liver: Secondary | ICD-10-CM | POA: Diagnosis not present

## 2018-10-13 DIAGNOSIS — K760 Fatty (change of) liver, not elsewhere classified: Secondary | ICD-10-CM | POA: Diagnosis not present

## 2018-10-13 DIAGNOSIS — Z6841 Body Mass Index (BMI) 40.0 and over, adult: Secondary | ICD-10-CM | POA: Diagnosis not present

## 2018-11-02 DIAGNOSIS — I10 Essential (primary) hypertension: Secondary | ICD-10-CM | POA: Diagnosis not present

## 2018-11-02 DIAGNOSIS — Z79899 Other long term (current) drug therapy: Secondary | ICD-10-CM | POA: Diagnosis not present

## 2018-11-02 DIAGNOSIS — E119 Type 2 diabetes mellitus without complications: Secondary | ICD-10-CM | POA: Diagnosis not present

## 2018-11-02 DIAGNOSIS — E039 Hypothyroidism, unspecified: Secondary | ICD-10-CM | POA: Diagnosis not present

## 2019-01-01 DIAGNOSIS — E785 Hyperlipidemia, unspecified: Secondary | ICD-10-CM | POA: Diagnosis not present

## 2019-03-02 DIAGNOSIS — Z5181 Encounter for therapeutic drug level monitoring: Secondary | ICD-10-CM | POA: Diagnosis not present

## 2019-03-02 DIAGNOSIS — E039 Hypothyroidism, unspecified: Secondary | ICD-10-CM | POA: Diagnosis not present

## 2019-03-02 DIAGNOSIS — E119 Type 2 diabetes mellitus without complications: Secondary | ICD-10-CM | POA: Diagnosis not present

## 2019-03-02 DIAGNOSIS — I10 Essential (primary) hypertension: Secondary | ICD-10-CM | POA: Diagnosis not present

## 2019-03-09 IMAGING — US US ABDOMEN LIMITED
1 series · 14 of 25 positions shown · non-contrast
Comparison: CT abdomen and pelvis of 09/28/2014

CLINICAL DATA: Right upper quadrant pain and nausea over the last
month

EXAM:
ULTRASOUND ABDOMEN LIMITED RIGHT UPPER QUADRANT

[Series 1: us abdomen limited · 0.17mm/px · 14 of 51 slices shown]
[im 1/51]
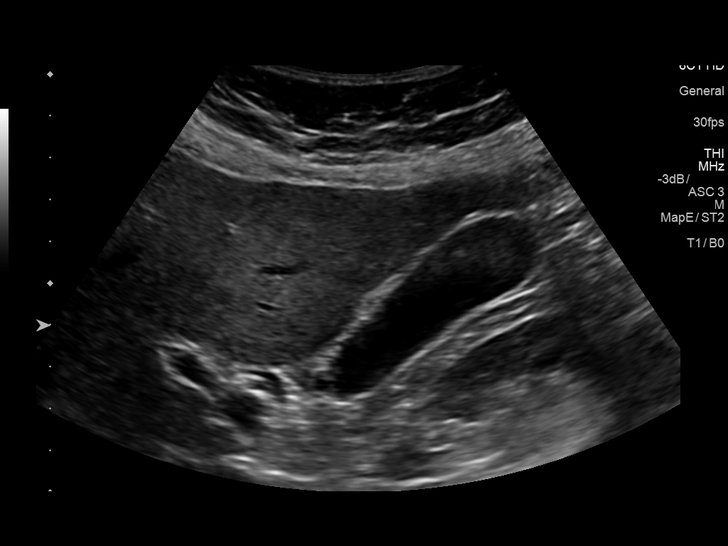
[im 5/51]
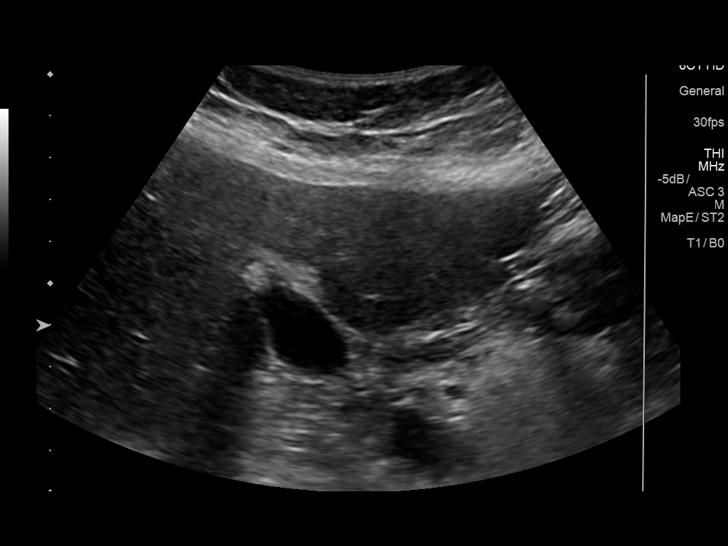
[im 9/51]
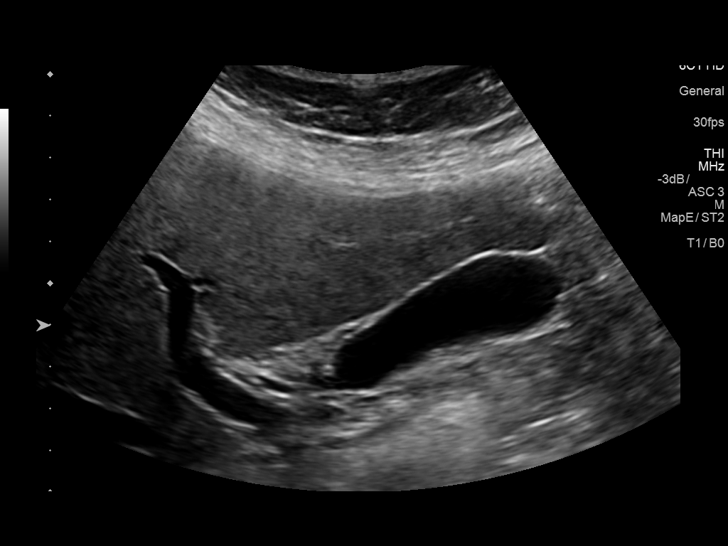
[im 13/51]
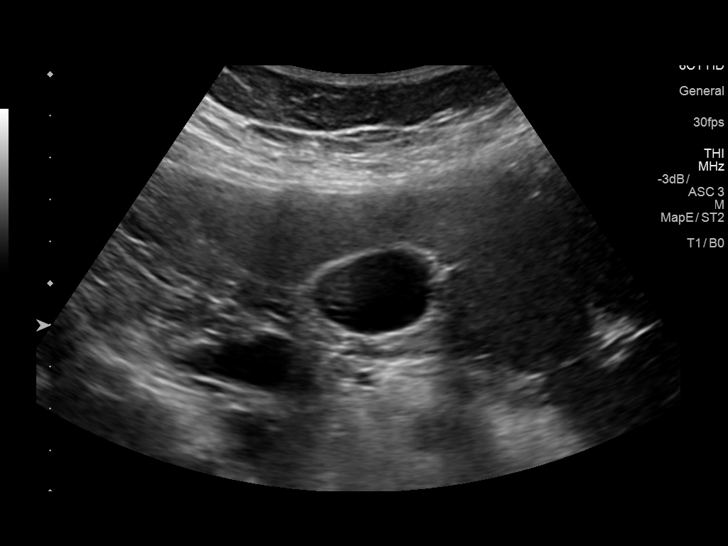
[im 17/51]
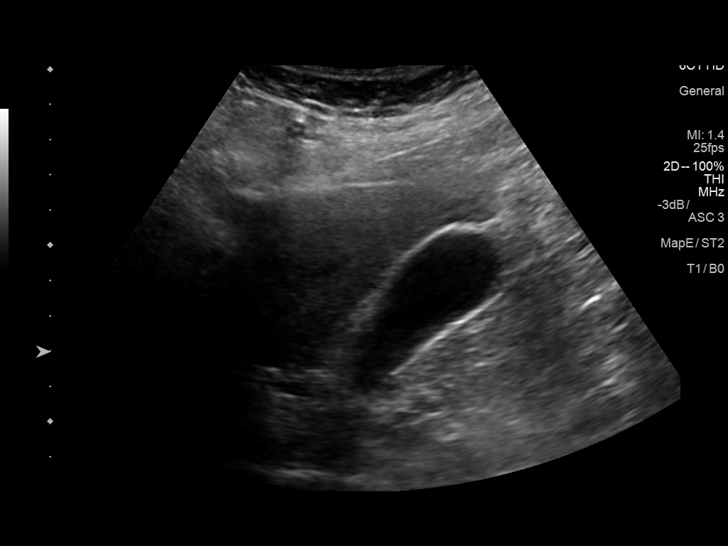
[im 19/51]
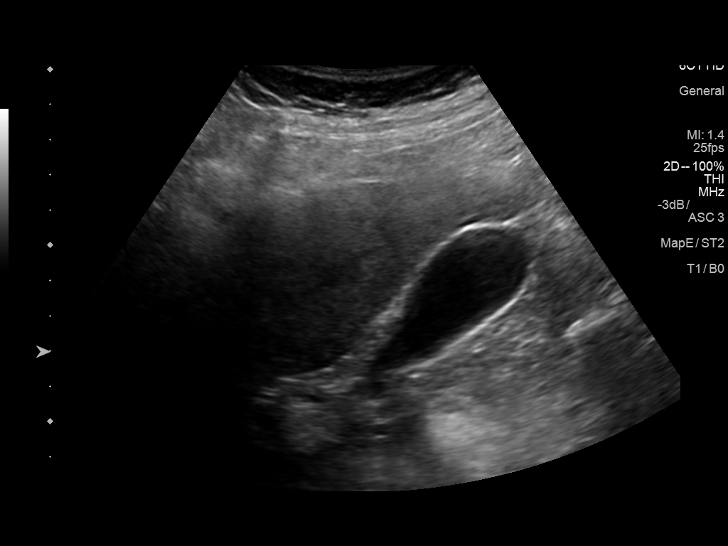
[im 23/51]
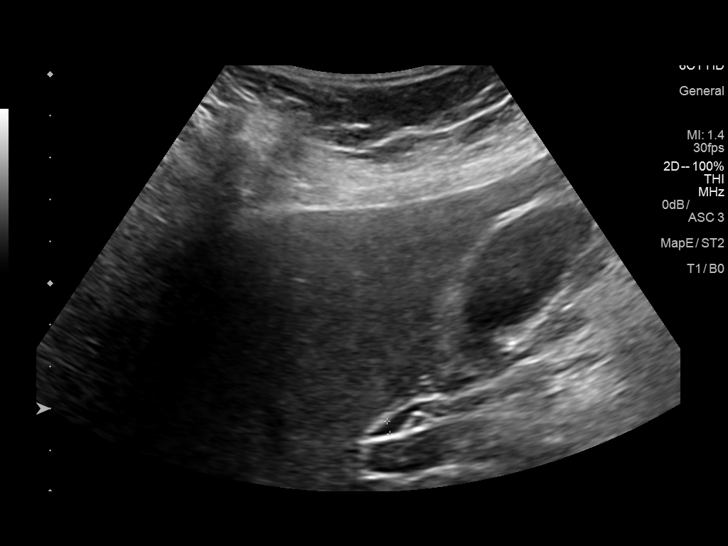
[im 28/51]
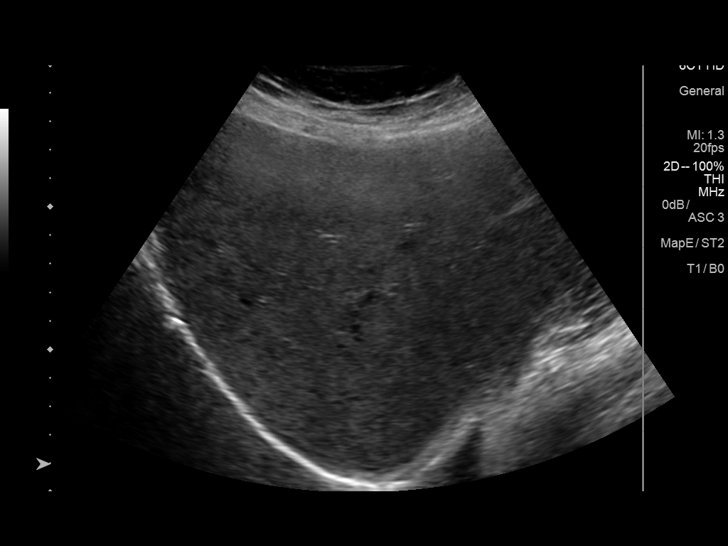
[im 32/51]
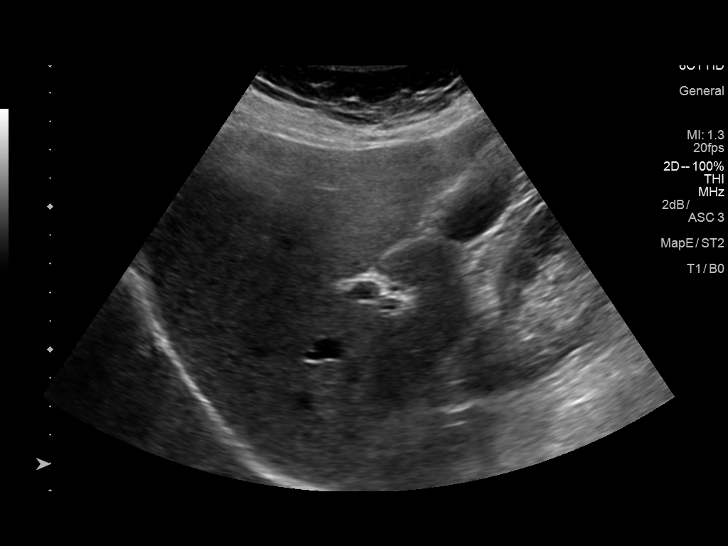
[im 34/51]
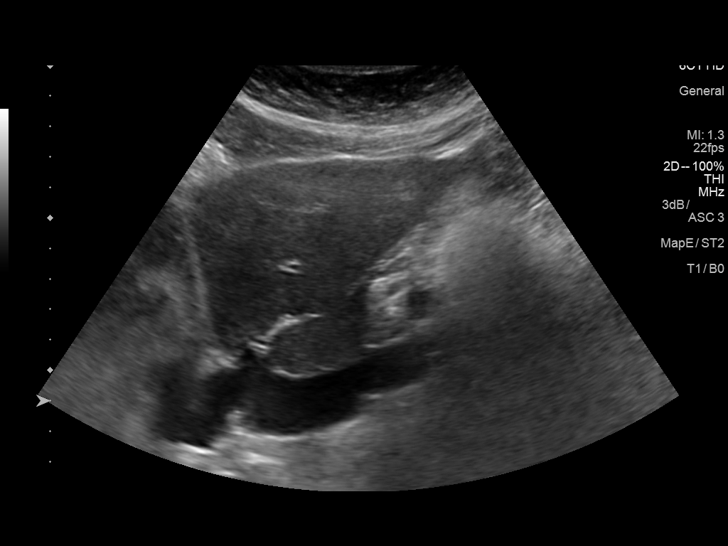
[im 38/51]
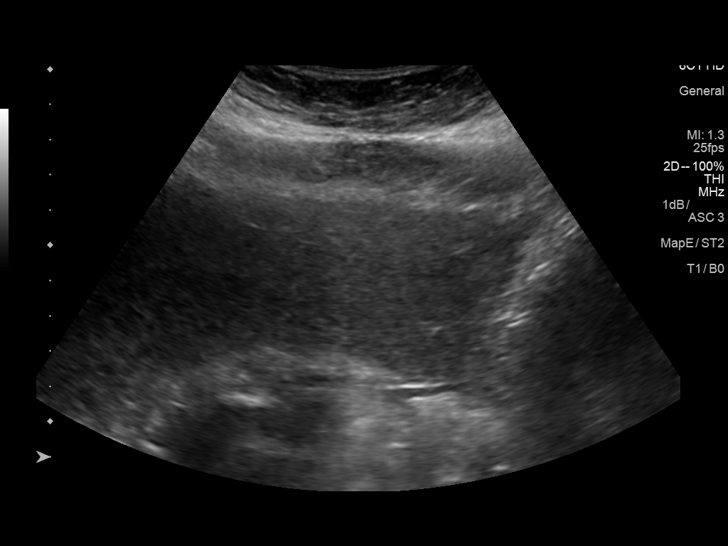
[im 42/51]
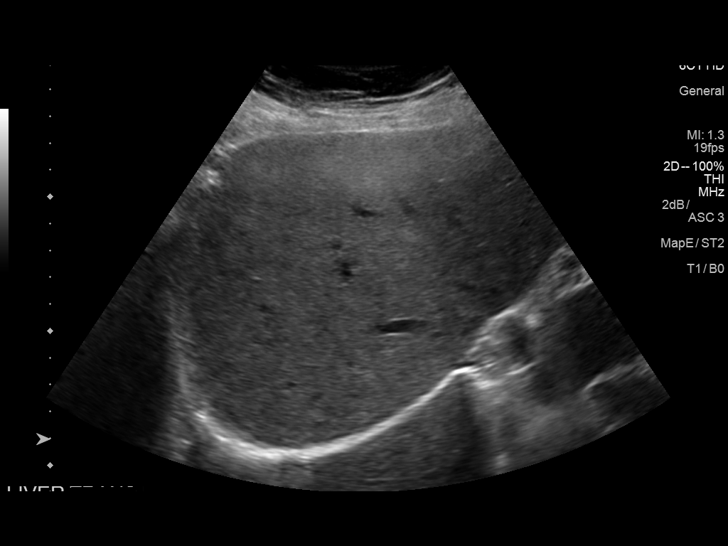
[im 46/51]
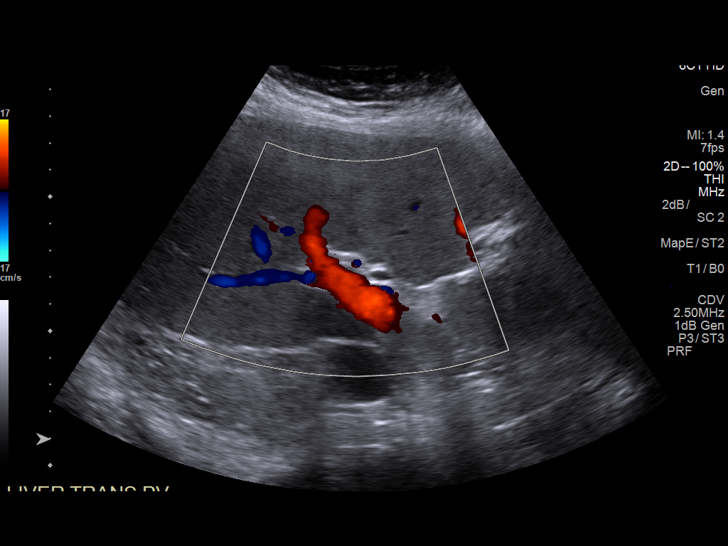
[im 51/51]
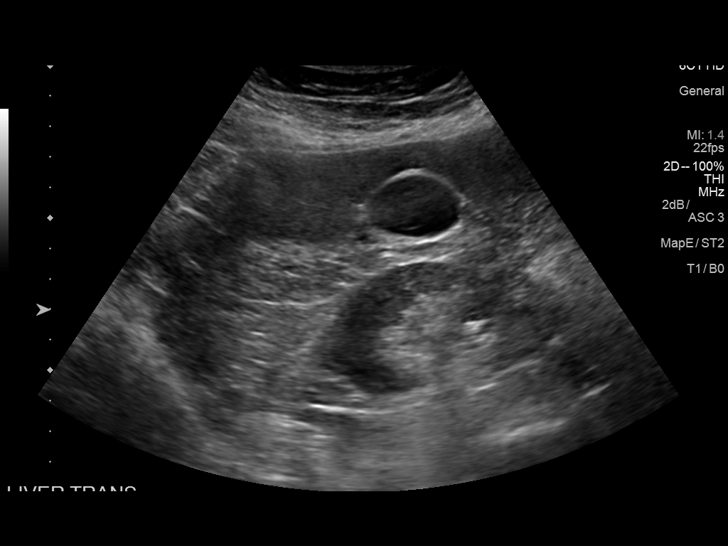

[14 of 25 positions shown; findings below may reference images not displayed]

FINDINGS: Gallbladder:

The gallbladder is visualized and no gallstones are noted. There is
no pain over the gallbladder with compression.

Common bile duct:

Diameter: The common bile duct is normal measuring 2.4 mm in
diameter.

Liver:

The parenchyma of the liver is somewhat echogenic and inhomogeneous
which may indicate mild hepatic steatosis. Correlate clinically. No
focal hepatic abnormality is seen. Portal vein is patent on color
Doppler imaging with normal direction of blood flow towards the
liver.
IMPRESSION: 1. Inhomogeneous echogenic liver parenchyma may indicate hepatic
steatosis. Correlate with LFTs.
2. No gallstones.  No ductal dilatation.

## 2019-04-29 DIAGNOSIS — Z803 Family history of malignant neoplasm of breast: Secondary | ICD-10-CM | POA: Diagnosis not present

## 2019-04-29 DIAGNOSIS — Z1231 Encounter for screening mammogram for malignant neoplasm of breast: Secondary | ICD-10-CM | POA: Diagnosis not present

## 2019-05-31 DIAGNOSIS — H5319 Other subjective visual disturbances: Secondary | ICD-10-CM | POA: Diagnosis not present

## 2019-05-31 DIAGNOSIS — G43B0 Ophthalmoplegic migraine, not intractable: Secondary | ICD-10-CM | POA: Diagnosis not present

## 2019-06-01 DIAGNOSIS — I10 Essential (primary) hypertension: Secondary | ICD-10-CM | POA: Diagnosis not present

## 2019-06-22 DIAGNOSIS — F331 Major depressive disorder, recurrent, moderate: Secondary | ICD-10-CM | POA: Diagnosis not present

## 2019-06-22 DIAGNOSIS — I10 Essential (primary) hypertension: Secondary | ICD-10-CM | POA: Diagnosis not present

## 2019-07-08 DIAGNOSIS — H353132 Nonexudative age-related macular degeneration, bilateral, intermediate dry stage: Secondary | ICD-10-CM | POA: Diagnosis not present

## 2019-07-08 DIAGNOSIS — H40013 Open angle with borderline findings, low risk, bilateral: Secondary | ICD-10-CM | POA: Diagnosis not present

## 2019-07-08 DIAGNOSIS — E119 Type 2 diabetes mellitus without complications: Secondary | ICD-10-CM | POA: Diagnosis not present

## 2019-07-08 DIAGNOSIS — H35013 Changes in retinal vascular appearance, bilateral: Secondary | ICD-10-CM | POA: Diagnosis not present
